# Patient Record
Sex: Male | Born: 1955 | Race: White | Marital: Single
Health system: Southern US, Community
[De-identification: ages and names within clinical notes are randomized; demographics above are authoritative.]

---

## 2014-08-26 ENCOUNTER — Other Ambulatory Visit: Payer: Self-pay | Admitting: Family Medicine

## 2014-08-26 DIAGNOSIS — R1032 Left lower quadrant pain: Secondary | ICD-10-CM

## 2014-08-30 ENCOUNTER — Ambulatory Visit
Admission: RE | Admit: 2014-08-30 | Discharge: 2014-08-30 | Disposition: A | Discharge status: Home / Self Care | Payer: BC Managed Care – PPO | Source: Ambulatory Visit | Attending: Family Medicine | Admitting: Family Medicine

## 2014-08-30 DIAGNOSIS — R1032 Left lower quadrant pain: Secondary | ICD-10-CM

## 2014-08-30 MED ORDER — IOHEXOL 300 MG/ML  SOLN
125.0000 mL | Freq: Once | INTRAMUSCULAR | Status: AC | PRN
  Administered 2014-08-30: 125 mL via INTRAVENOUS

## 2014-09-20 ENCOUNTER — Ambulatory Visit (INDEPENDENT_AMBULATORY_CARE_PROVIDER_SITE_OTHER): Payer: BC Managed Care – PPO | Admitting: Family Medicine

## 2014-09-20 ENCOUNTER — Other Ambulatory Visit (INDEPENDENT_AMBULATORY_CARE_PROVIDER_SITE_OTHER): Payer: BC Managed Care – PPO

## 2014-09-20 ENCOUNTER — Ambulatory Visit (INDEPENDENT_AMBULATORY_CARE_PROVIDER_SITE_OTHER)
Admission: RE | Admit: 2014-09-20 | Discharge: 2014-09-20 | Disposition: A | Discharge status: Home / Self Care | Payer: BC Managed Care – PPO | Source: Ambulatory Visit | Attending: Family Medicine | Admitting: Family Medicine

## 2014-09-20 VITALS — BP 140/84 | HR 72 | Ht 70.0 in | Wt 270.0 lb

## 2014-09-20 DIAGNOSIS — G5600 Carpal tunnel syndrome, unspecified upper limb: Secondary | ICD-10-CM | POA: Insufficient documentation

## 2014-09-20 DIAGNOSIS — R200 Anesthesia of skin: Secondary | ICD-10-CM

## 2014-09-20 DIAGNOSIS — M79641 Pain in right hand: Secondary | ICD-10-CM

## 2014-09-20 DIAGNOSIS — G5601 Carpal tunnel syndrome, right upper limb: Secondary | ICD-10-CM

## 2014-09-20 DIAGNOSIS — R202 Paresthesia of skin: Secondary | ICD-10-CM

## 2014-09-20 DIAGNOSIS — M542 Cervicalgia: Secondary | ICD-10-CM | POA: Insufficient documentation

## 2014-09-20 MED ORDER — DICLOFENAC SODIUM 2 % TD SOLN: TRANSDERMAL | Status: AC

## 2014-09-20 NOTE — Progress Notes (Signed)
Tawana ScaleZach Smith D.O. [facility] [address] [place], [place] [address] Phone: [phone] Subjective:     CC: right arm pain  BJY:NWGNFAOZHYHPI:Subjective Luis NevinRobert Banks is a 58 y.o. male coming in with complaint of right arm pain. Patient states he has had this pain for multiple decades. It was intermittent. Patient states now he is having constant numbness over the course last several months in his hand. Patient does work in [employer] and does sit at a computer a significant amount of time. Patient states that the numbness now seems to be affecting some of his job. Patient states at night adjacent some discomfort as well and seems to radiate up his arm towards his neck. Patient has seen other providers for this and has been diagnosed with carpal tunnel as well as radial nerve injury. Patient has seen a chiropractor with no significant improvement. Patient has tried massage as well as acupuncture with minimal improvement. Patient has been taking over-the-counter ibuprofen fairly regularly. Patient has tried splinting of the wrist with no significant improvement. Patient rates the severity is 7 out of 10 and can have pain bad enough to wake him up at night.     Past medical history, social, surgical and family history all reviewed in electronic medical record.   Review of Systems: No headache, visual changes, nausea, vomiting, diarrhea, constipation, dizziness, abdominal pain, skin rash, fevers, chills, night sweats, weight loss, swollen lymph nodes, body aches, joint swelling, muscle aches, chest pain, shortness of breath, mood changes.   Objective Blood pressure 140/84, pulse 72, weight 270 lb (122.471 kg), SpO2 97 %.  General: No apparent distress alert and oriented x3 mood and affect normal, dressed appropriately.  HEENT: Pupils equal, extraocular movements intact  Respiratory: Patient's speak in full sentences and does not appear short of breath  Cardiovascular: No lower extremity edema, non tender,  no erythema  Skin: Warm dry intact with no signs of infection or rash on extremities or on axial skeleton.  Abdomen: Soft nontender  Neuro: Cranial nerves II through XII are intact, neurovascularly intact in all extremities with 2+ DTRs and 2+ pulses.  Lymph: No lymphadenopathy of posterior or anterior cervical chain or axillae bilaterally.  Gait normal with good balance and coordination.  MSK:  Non tender with full range of motion and good stability and symmetric strength and tone of shoulders, elbows, hip, knee and ankles bilaterally.   Neck: Inspection unremarkable. No palpable stepoffs. Negative Spurling's maneuver with no radicular symptoms but does have pain with compression Full neck range of motion Grip strength and sensation normal in bilateral hands Strength good C4 to T1 distribution No sensory change to C4 to T1 Negative Hoffman sign bilaterally Reflexes normal  Wrist:right Inspection normal with no visible erythema or swelling. ROM smooth and normal with good flexion and extension and ulnar/radial deviation that is symmetrical with opposite wrist. Palpation is normal over metacarpals, navicular, lunate, and TFCC; tendons without tenderness/ swelling No snuffbox tenderness. No tenderness over Canal of Guyon. Strength 5/5 in all directions without pain. Negative Finkelstein,positive tinel's and phalens. Negative Watson's test. Contralateral wrist unremarkable  MSK US performed of: right wrist This study was ordered, performed, and interpreted by Terrilee FilesZach Smith D.O.  Wrist: All extensor compartments visualized and tendons all normal in appearance without fraying, tears, or sheath effusions. No effusion seen. TFCC intact. Scapholunate ligament intact. Carpal tunnel visualized and median nerve does have significant hypoechoic changes but not significant enlargement. Patient's area measured 1.45 cm but does have indentation  on the volar aspect. Significant scarring noted of  the surrounding sheath.  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      Impression and Recommendations:     This case required medical decision making of moderate complexity.

## 2014-09-20 NOTE — Patient Instructions (Signed)
Good to meet you Ice 20 minutes 2 times daily. Usually after activity and before bed. Exercises 3 times a week. Alternate neck and wrist.  Wear the brace at night only for next 2 weeks.  Try pennsaid 2 times daily to wrist and neck/.  Neck xray downstairs today.  See me again in 2 weeks and if not better then we will try an injection.

## 2014-09-20 NOTE — Assessment & Plan Note (Signed)
I do believe the patient does have some chronic irritation to the median nerve. There is an indentation and could be secondary to chronic aspect of his work seen on ultrasound today I could be contribute. There is no significant enlargement of the nerve and patient does not have weakness but the chronic numbness is concerning. Patient would like to avoid any type of surgical intervention. We will try topical anti-inflammatories, bracing, as well as get neck x-rays to rule out any cervical radiculopathy that could be contribute. Patient will try these interventions and come back again in 2 weeks. If continuing to have pain I would consider a ultrasound guided injection within intraneuronal sheath.

## 2014-10-05 ENCOUNTER — Ambulatory Visit: Payer: BC Managed Care – PPO | Admitting: Family Medicine

## 2014-10-11 ENCOUNTER — Ambulatory Visit: Payer: BC Managed Care – PPO | Admitting: Family Medicine

## 2014-10-12 ENCOUNTER — Other Ambulatory Visit (INDEPENDENT_AMBULATORY_CARE_PROVIDER_SITE_OTHER): Payer: BC Managed Care – PPO

## 2014-10-12 ENCOUNTER — Ambulatory Visit (INDEPENDENT_AMBULATORY_CARE_PROVIDER_SITE_OTHER): Payer: BC Managed Care – PPO | Admitting: Family Medicine

## 2014-10-12 VITALS — HR 65 | Wt 270.0 lb

## 2014-10-12 DIAGNOSIS — G5601 Carpal tunnel syndrome, right upper limb: Secondary | ICD-10-CM

## 2014-10-12 NOTE — Progress Notes (Signed)
Tawana ScaleZach Kase Shughart D.O. [facility] [address] [place], [place] [address] Phone: [phone]) [phone] Subjective:     CC: right arm pain  BJY:NWGNFAOZHYHPI:Subjective Angela NevinRobert Doughman is a 58 y.o. male coming in with complaint of right arm pain. Patient was found previously to have carpal tunnel syndrome on ultrasound. Patient did have significant thickening as well as tightness of the retinaculum. Patient has been trying conservative therapy with bracing, icing, and home exercises. Patient states that he has not made any significant improvement. States that the topical anti-inflammatories as not helping either. Patient continues to have difficulty doing activities of daily living specially working sometimes nighttime awakenings. Continues to have chronic numbness.     Past medical history, social, surgical and family history all reviewed in electronic medical record.   Review of Systems: No headache, visual changes, nausea, vomiting, diarrhea, constipation, dizziness, abdominal pain, skin rash, fevers, chills, night sweats, weight loss, swollen lymph nodes, body aches, joint swelling, muscle aches, chest pain, shortness of breath, mood changes.   Objective Pulse 65, weight 270 lb (122.471 kg), SpO2 95 %.  General: No apparent distress alert and oriented x3 mood and affect normal, dressed appropriately.  HEENT: Pupils equal, extraocular movements intact  Respiratory: Patient's speak in full sentences and does not appear short of breath  Cardiovascular: No lower extremity edema, non tender, no erythema  Skin: Warm dry intact with no signs of infection or rash on extremities or on axial skeleton.  Abdomen: Soft nontender  Neuro: Cranial nerves II through XII are intact, neurovascularly intact in all extremities with 2+ DTRs and 2+ pulses.  Lymph: No lymphadenopathy of posterior or anterior cervical chain or axillae bilaterally.  Gait normal with good balance and coordination.  MSK:  Non tender with full  range of motion and good stability and symmetric strength and tone of shoulders, elbows, hip, knee and ankles bilaterally.   Neck: Inspection unremarkable. No palpable stepoffs. Negative Spurling's maneuver with no radicular symptoms but does have pain with compression Full neck range of motion Grip strength and sensation normal in bilateral hands Strength good C4 to T1 distribution No sensory change to C4 to T1 Negative Hoffman sign bilaterally Reflexes normal  Wrist:right Inspection normal with no visible erythema or swelling. ROM smooth and normal with good flexion and extension and ulnar/radial deviation that is symmetrical with opposite wrist. Palpation is normal over metacarpals, navicular, lunate, and TFCC; tendons without tenderness/ swelling No snuffbox tenderness. No tenderness over Canal of Guyon. Strength 5/5 in all directions without pain. Negative Finkelstein,positive tinel's and phalens. Negative Watson's test. Contralateral wrist unremarkable  Procedure: Real-time Ultrasound Guided Injection of right median nerve Device: GE Logiq E  Ultrasound guided injection is preferred based studies that show increased duration, increased effect, greater accuracy, decreased procedural pain, increased response rate, and decreased cost with ultrasound guided versus blind injection.  Verbal informed consent obtained.  Time-out conducted.  Noted no overlying erythema, induration, or other signs of local infection.  Skin prepped in a sterile fashion.  Local anesthesia: Topical Ethyl chloride.  With sterile technique and under real time ultrasound guidance:  25 g needle injected with 0.5 mL of 0.5% Marcaine and 0.5 mL of: 40 mg/dL within the median nerve sheath Completed without difficulty  Pain immediately resolved suggesting accurate placement of the medication.  Advised to call if fevers/chills, erythema, induration, drainage, or persistent bleeding.  Images permanently stored and  available for review in the ultrasound unit.  Impression: Technically successful ultrasound guided injection.  Impression and Recommendations:     This case required medical decision making of moderate complexity.

## 2014-10-12 NOTE — Patient Instructions (Signed)
We did the injection today.  Ice is your friend at the end of the day and in 6 hours.  COntinue the brace for next 72 hours.  Then wear nightly for 1 week.  Call me in 2 weeks if not better.  Otherwise see me when you need me

## 2014-10-12 NOTE — Assessment & Plan Note (Signed)
Patient was given an injection today and we will hope that this will help with conservative therapy. We discussed icing protocol, discussed home exercises as well as bracing. Patient and will follow-up in 2 weeks for further evaluation. We discussed the possibility of surgical intervention may be necessary at the systemic any significant improvement. I'm more than optimistic the patient will do very well though. Differential also includes some cervical radiculopathy but he appears to be doing relatively well with this.  Spent greater than 25 minutes with patient face-to-face and had greater than 50% of counseling including as described above in assessment and plan.

## 2014-11-02 ENCOUNTER — Ambulatory Visit (INDEPENDENT_AMBULATORY_CARE_PROVIDER_SITE_OTHER): Payer: BC Managed Care – PPO | Admitting: Family Medicine

## 2014-11-02 ENCOUNTER — Encounter: Payer: Self-pay | Admitting: Family Medicine

## 2014-11-02 VITALS — BP 138/86 | HR 70 | Ht 70.0 in | Wt 271.0 lb

## 2014-11-02 DIAGNOSIS — G5601 Carpal tunnel syndrome, right upper limb: Secondary | ICD-10-CM

## 2014-11-02 NOTE — Progress Notes (Signed)
  Tawana ScaleZach Kyannah Climer D.O. [facility] [address] [place], [place] [address] Phone: [phone]([phone] Subjective:     CC: right arm pain follow up  BJY:NWGNFAOZHYHPI:Subjective Angela NevinRobert Russett is a 58 y.o. male coming in with complaint of right arm pain. Patient was found previously to have carpal tunnel syndrome on ultrasound. Patient did have significant thickening as well as tightness of the retinaculum. Patient did not make any significant improvement with conservative therapy so an injection was tried. Patient was to continue the topical anti-inflammatories and the other conservative measures we discussed previously. Patient states injection only helped for a couple hours and then seemed to come back to the same amount. Patient states he's continue with the bracing, icing and home exercises. Patient states that now the numbness in the thumb, index and middle finger seems to be worsening. Seems to be constant.    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 No neck pain and no association with radicular symptoms on testing previously.  Past medical history, social, surgical and family history all reviewed in electronic medical record.   Review of Systems: No headache, visual changes, nausea, vomiting, diarrhea, constipation, dizziness, abdominal pain, skin rash, fevers, chills, night sweats, weight loss, swollen lymph nodes, body aches, joint swelling, muscle aches, chest pain, shortness of breath, mood changes.   Objective Blood pressure 138/86, pulse 70, height 5\' 10"  (1.778 m), weight 271 lb (122.925 kg), SpO2 97 %.  General: No apparent distress alert and oriented x3 mood and affect normal, dressed appropriately.  HEENT: Pupils equal, extraocular movements intact  Respiratory: Patient's speak in full sentences and does not appear short of breath  Cardiovascular: No lower extremity edema, non tender, no erythema  Skin: Warm dry intact with no signs of  infection or rash on extremities or on axial skeleton.  Abdomen: Soft nontender  Neuro: Cranial nerves II through XII are intact, neurovascularly intact in all extremities with 2+ DTRs and 2+ pulses.  Lymph: No lymphadenopathy of posterior or anterior cervical chain or axillae bilaterally.  Gait normal with good balance and coordination.  MSK:  Non tender with full range of motion and good stability and symmetric strength and tone of shoulders, elbows, hip, knee and ankles bilaterally.   Neck: Inspection unremarkable. No palpable stepoffs. Negative Spurling's maneuver with no radicular symptoms  Full neck range of motion Grip strength and sensation normal in bilateral hands Strength good C4 to T1 distribution No sensory change to C4 to T1 Negative Hoffman sign bilaterally Reflexes normal  Wrist:right Inspection normal with no visible erythema or swelling. ROM smooth and normal with good flexion and extension and ulnar/radial deviation that is symmetrical with opposite wrist. Palpation is normal over metacarpals, navicular, lunate, and TFCC; tendons without tenderness/ swelling No snuffbox tenderness. No tenderness over Canal of Guyon. Strength 5/5 in all directions without pain. Negative Finkelstein,positive tinel's and phalens. May be worse than previous exam Negative Watson's test. Contralateral wrist unremarkable        Impression and Recommendations:     This case required medical decision making of moderate complexity.

## 2014-11-02 NOTE — Assessment & Plan Note (Signed)
Patient has not made any significant improvement at this time. Patient is failed ultrasound guided injection, conservative therapy including home exercises, bracing, topical and oral anti-inflammatories. Patient has had this along duration and this is affecting his activities of daily living as well as his work. I believe with patient's job that likely surgical intervention is necessary. Patient on previous ultrasound did show that patient had chronic inflammatory changes of the median nerve as well as distortion secondary to the tightness of the retinaculum. Patient has been referred to Dr. Ave Filterhandler.  Patient understands that he can ask any questions with me if he has any. Patient will continue with the conservative therapy at this time.

## 2014-11-02 NOTE — Patient Instructions (Signed)
Good to see you.   Happy holidays!  We will get you in for the surgery at this time Call me or email me any questions you have.  I am here if you need me.

## 2015-06-02 IMAGING — CR DG CERVICAL SPINE COMPLETE 4+V
5 series · 5 of 5 positions shown · non-contrast
Comparison: None.

CLINICAL DATA: Posterior neck pain for the past 2 months. No known
injury.

EXAM:
CERVICAL SPINE  4+ VIEWS

[view not recorded (1 of 5)]
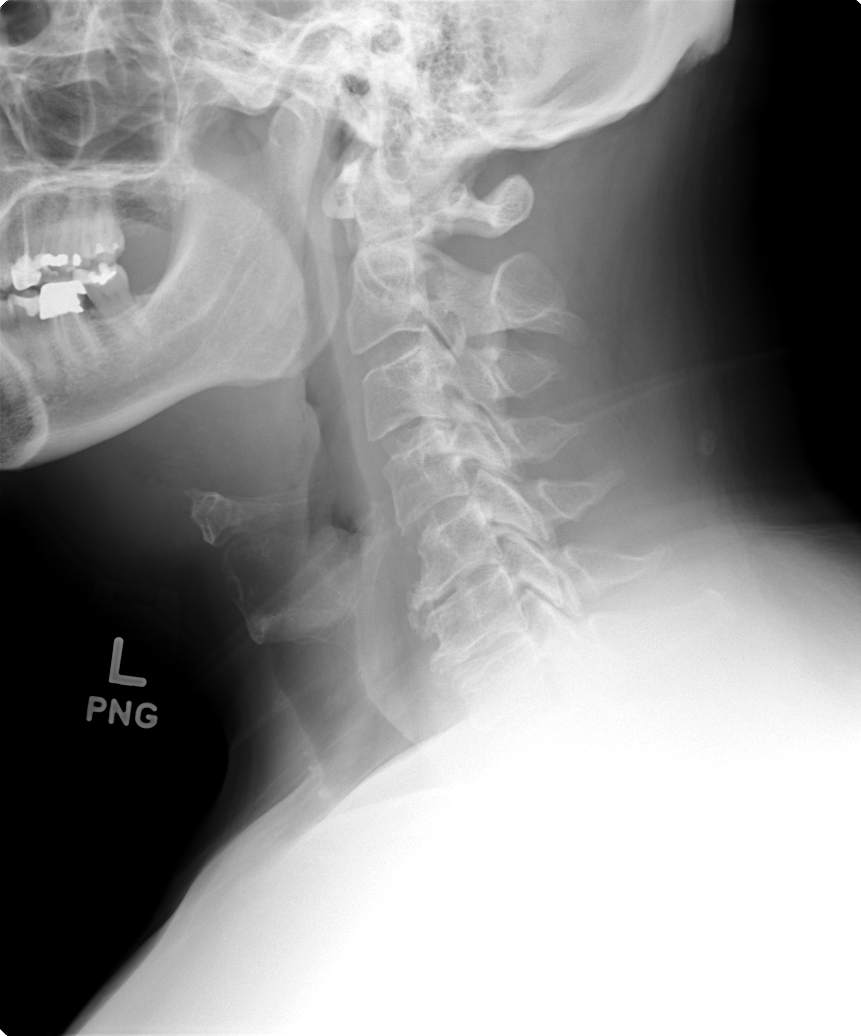

[view not recorded (2 of 5)]
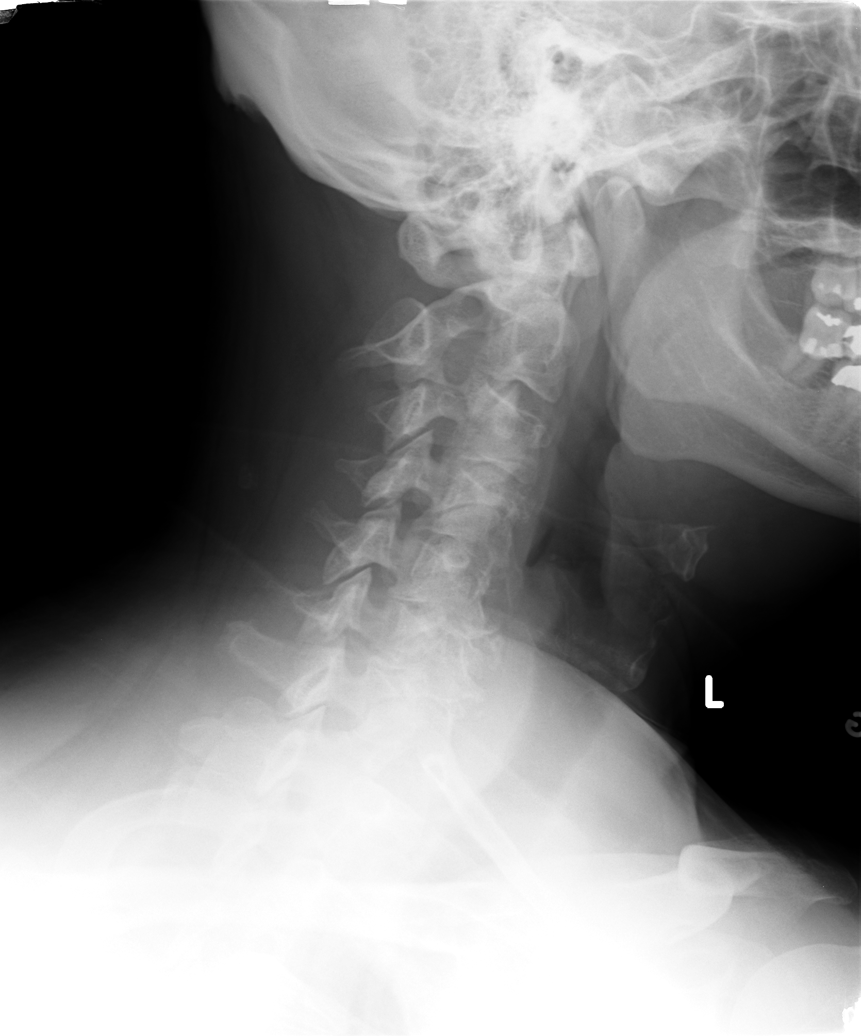

[view not recorded (3 of 5)]
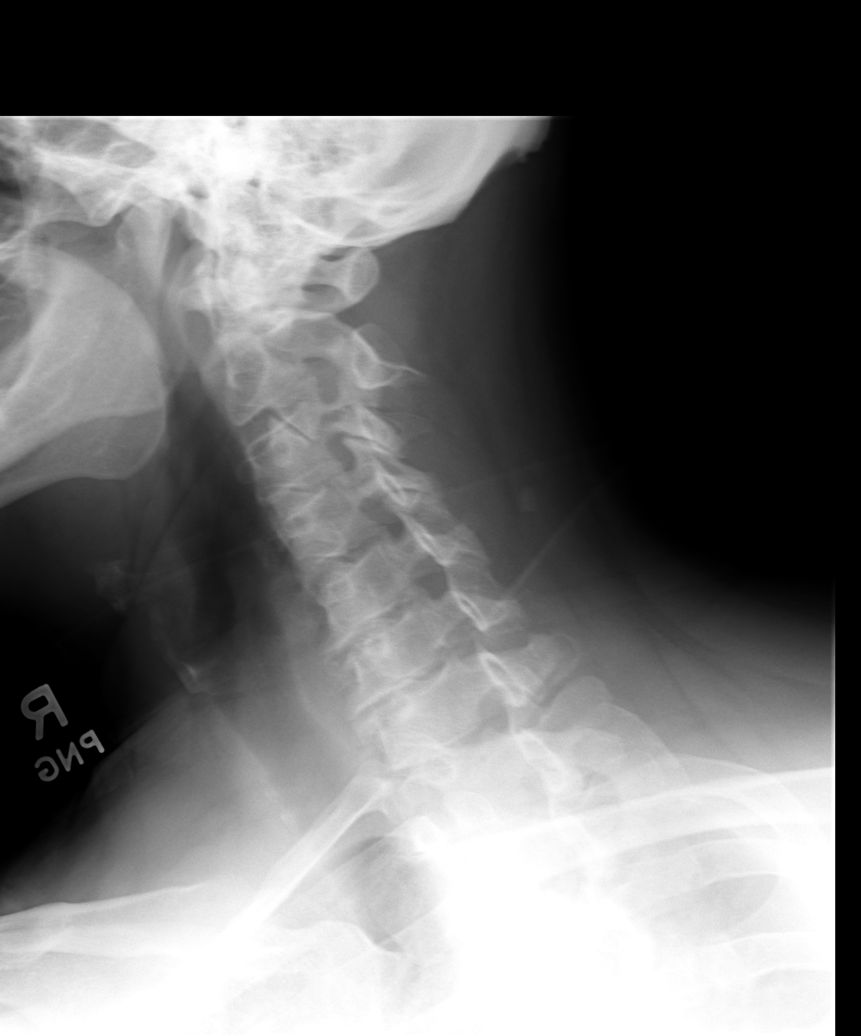

[view not recorded (4 of 5)]
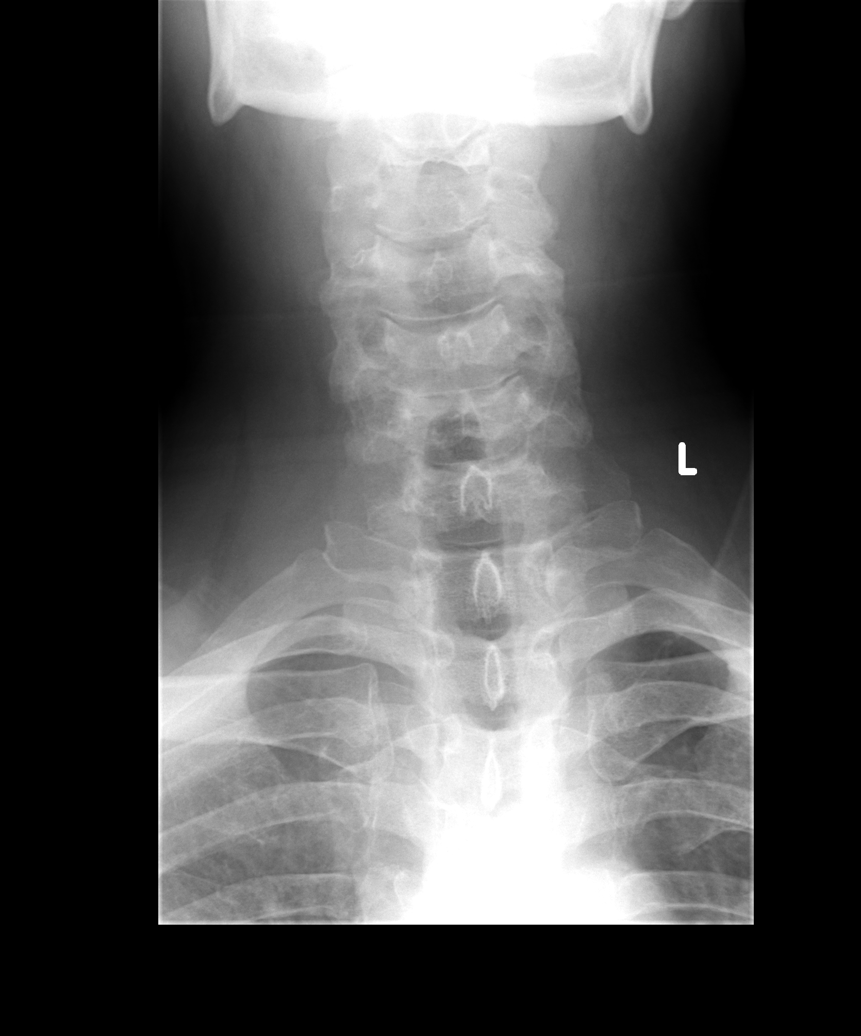

[view not recorded (5 of 5)]
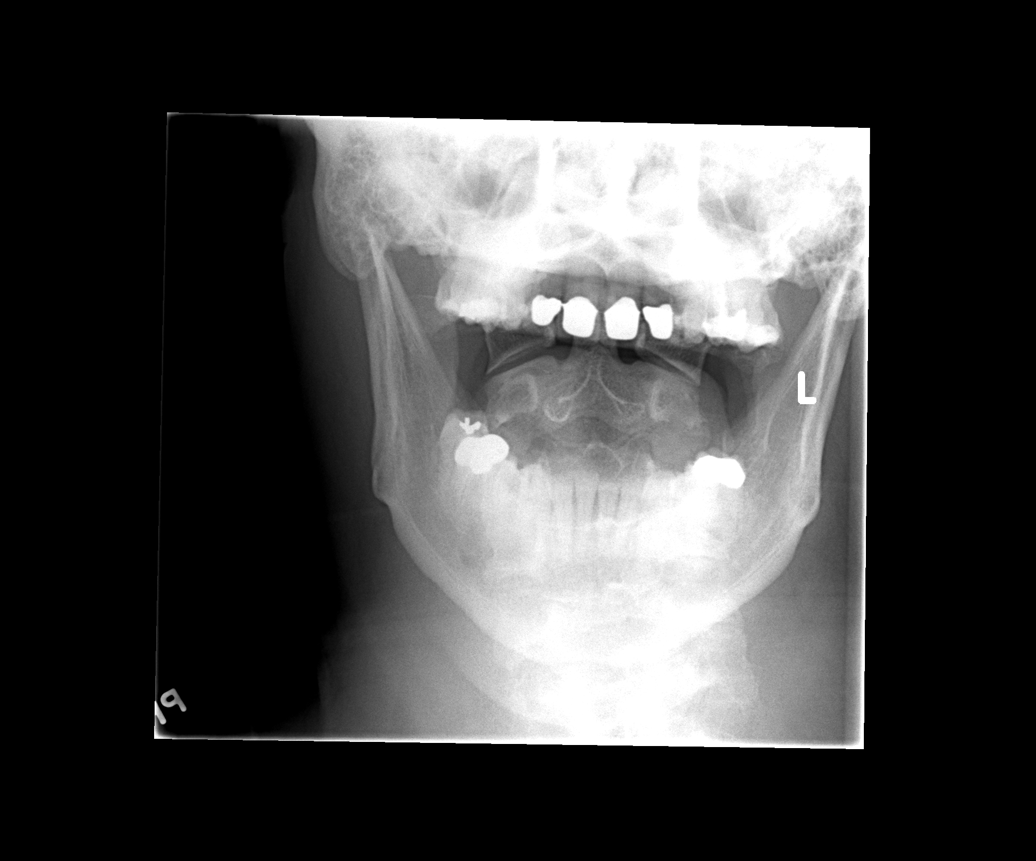

[5 of 5 positions shown; findings below may reference images not displayed]

FINDINGS: Minimal anterior spur formation at the C3-4 level. Mild anterior
spur formation at the C4-5 level. Moderate disc space narrowing with
moderately large anterior spurs at the C5-6 and C6-7 levels.
Uncinate spurs producing mild foraminal stenosis on the right at the
C4-5 level and minimal foraminal stenosis on the right at the C5-6
level. Uncinate spurs producing no significant foraminal stenosis on
the left at multiple levels. Mild dextroconvex scoliosis.
IMPRESSION: Degenerative changes, as described above.
